# Patient Record
Sex: Female | Born: 1980 | Race: White | Hispanic: No | Marital: Single | Smoking: Current every day smoker
Health system: Southern US, Community
[De-identification: ages and names within clinical notes are randomized; demographics above are authoritative.]

## PROBLEM LIST (undated history)

## (undated) DIAGNOSIS — F329 Major depressive disorder, single episode, unspecified: Secondary | ICD-10-CM

## (undated) DIAGNOSIS — F32A Depression, unspecified: Secondary | ICD-10-CM

## (undated) DIAGNOSIS — F419 Anxiety disorder, unspecified: Secondary | ICD-10-CM

## (undated) HISTORY — PX: HERNIA REPAIR: SHX51

## (undated) HISTORY — DX: Anxiety disorder, unspecified: F41.9

## (undated) HISTORY — PX: DILATION AND CURETTAGE OF UTERUS: SHX78

---
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
## 2002-02-12 ENCOUNTER — Other Ambulatory Visit: Admission: RE | Admit: 2002-02-12 | Discharge: 2002-02-12 | Payer: Self-pay | Admitting: Obstetrics and Gynecology

## 2002-02-13 ENCOUNTER — Ambulatory Visit (HOSPITAL_COMMUNITY): Admission: RE | Admit: 2002-02-13 | Discharge: 2002-02-13 | Payer: Self-pay | Admitting: Obstetrics and Gynecology

## 2002-12-30 ENCOUNTER — Other Ambulatory Visit: Admission: RE | Admit: 2002-12-30 | Discharge: 2002-12-30 | Payer: Self-pay | Admitting: Family Medicine

## 2003-03-15 ENCOUNTER — Encounter: Payer: Self-pay | Admitting: Family Medicine

## 2003-03-15 ENCOUNTER — Ambulatory Visit (HOSPITAL_COMMUNITY): Admission: RE | Admit: 2003-03-15 | Discharge: 2003-03-15 | Payer: Self-pay | Admitting: Family Medicine

## 2003-03-30 ENCOUNTER — Other Ambulatory Visit: Admission: RE | Admit: 2003-03-30 | Discharge: 2003-03-30 | Payer: Self-pay | Admitting: Family Medicine

## 2005-05-03 ENCOUNTER — Other Ambulatory Visit: Admission: RE | Admit: 2005-05-03 | Discharge: 2005-05-03 | Payer: Self-pay | Admitting: Family Medicine

## 2006-02-15 ENCOUNTER — Ambulatory Visit: Payer: Self-pay | Admitting: Family Medicine

## 2006-02-25 ENCOUNTER — Ambulatory Visit: Payer: Self-pay | Admitting: Family Medicine

## 2006-03-04 ENCOUNTER — Ambulatory Visit: Payer: Self-pay | Admitting: Family Medicine

## 2006-07-10 ENCOUNTER — Ambulatory Visit: Payer: Self-pay | Admitting: Family Medicine

## 2006-08-08 ENCOUNTER — Ambulatory Visit: Payer: Self-pay | Admitting: Family Medicine

## 2006-10-14 ENCOUNTER — Ambulatory Visit: Payer: Self-pay | Admitting: Physician Assistant

## 2013-04-09 ENCOUNTER — Encounter (INDEPENDENT_AMBULATORY_CARE_PROVIDER_SITE_OTHER): Payer: Self-pay

## 2013-04-30 ENCOUNTER — Encounter (INDEPENDENT_AMBULATORY_CARE_PROVIDER_SITE_OTHER): Payer: Self-pay | Admitting: Surgery

## 2013-05-05 ENCOUNTER — Ambulatory Visit (INDEPENDENT_AMBULATORY_CARE_PROVIDER_SITE_OTHER): Payer: BC Managed Care – PPO | Admitting: Surgery

## 2013-10-01 ENCOUNTER — Other Ambulatory Visit: Payer: Self-pay

## 2013-12-29 ENCOUNTER — Encounter (INDEPENDENT_AMBULATORY_CARE_PROVIDER_SITE_OTHER): Payer: Self-pay | Admitting: Surgery

## 2014-01-08 ENCOUNTER — Encounter (INDEPENDENT_AMBULATORY_CARE_PROVIDER_SITE_OTHER): Payer: Self-pay | Admitting: Surgery

## 2014-01-08 ENCOUNTER — Ambulatory Visit (INDEPENDENT_AMBULATORY_CARE_PROVIDER_SITE_OTHER): Payer: Medicaid Other | Admitting: Surgery

## 2014-01-08 VITALS — BP 110/72 | HR 72 | Temp 99.0°F | Resp 14 | Ht 63.0 in | Wt 116.8 lb

## 2014-01-08 DIAGNOSIS — R140 Abdominal distension (gaseous): Secondary | ICD-10-CM

## 2014-01-08 DIAGNOSIS — R143 Flatulence: Secondary | ICD-10-CM

## 2014-01-08 DIAGNOSIS — R141 Gas pain: Secondary | ICD-10-CM

## 2014-01-08 DIAGNOSIS — K429 Umbilical hernia without obstruction or gangrene: Secondary | ICD-10-CM | POA: Insufficient documentation

## 2014-01-08 DIAGNOSIS — R112 Nausea with vomiting, unspecified: Secondary | ICD-10-CM

## 2014-01-08 DIAGNOSIS — R142 Eructation: Secondary | ICD-10-CM

## 2014-01-08 NOTE — Progress Notes (Signed)
Patient ID: Karina Cole, female   DOB: 11/09/1981, 33 y.o.   MRN: [id number]  Chief Complaint  Patient presents with  . New Evaluation    2nd opinion on UMB hernia    HPI Karina Cole is a 33 y.o. female.  Referred by Roma KayserKatie Skillman, PA-C. At [facility] For evaluation of umbilical hernia HPI This is a 33 year old female in good health who presents 6 years after giving birth to twins. Over the last couple of years she has noticed some swelling and protrusion above her umbilicus. Occasionally this area gets fairly large and uncomfortable and she has had to manually reduce this mass. Over the last several months, she has noted that whenever she eats she becomes very bloated and distended, associated with nausea, vomiting, and diarrhea. She denies any significant localized abdominal pain other than the generalized distention.  She was apparently evaluated by a surgeon in [place] who obtained a CT scan in 2013. This was read as unremarkable. We do not have these images available to review. The printed report does not mention her anterior abdominal wall. She presents now for further evaluation of her hernia.   Past Medical History  Diagnosis Date  . Anxiety     Past Surgical History  Procedure Laterality Date  . Hernia repair      when baby    Family History  Problem Relation Age of Onset  . Cancer Paternal Aunt     breast  . Cancer Paternal Uncle     colon  . Cancer Paternal Grandmother     breast  . Cancer Paternal Grandfather     colon    Social History History  Substance Use Topics  . Smoking status: Current Every Day Smoker  . Smokeless tobacco: Never Used  . Alcohol Use: Yes     Comment: sociall    No Known Allergies  Current Outpatient Prescriptions  Medication Sig Dispense Refill  . ALPRAZolam (XANAX) 1 MG tablet Take 1 mg by mouth at bedtime as needed for anxiety.      Marland Kitchen. FLUoxetine (PROZAC) 40 MG capsule Take 40 mg by mouth daily.        No current facility-administered medications for this visit.    Review of Systems Review of Systems  Constitutional: Negative for fever, chills and unexpected weight change.  HENT: Negative for congestion, hearing loss, sore throat, trouble swallowing and voice change.   Eyes: Negative for visual disturbance.  Respiratory: Negative for cough and wheezing.   Cardiovascular: Negative for chest pain, palpitations and leg swelling.  Gastrointestinal: Positive for nausea, vomiting, diarrhea and abdominal distention. Negative for abdominal pain, constipation, blood in stool and anal bleeding.  Genitourinary: Negative for hematuria, vaginal bleeding and difficulty urinating.  Musculoskeletal: Negative for arthralgias.  Skin: Negative for rash and wound.  Neurological: Negative for seizures, syncope and headaches.  Hematological: Negative for adenopathy. Does not bruise/bleed easily.  Psychiatric/Behavioral: Negative for confusion.    Blood pressure 110/72, pulse 72, temperature 99 F (37.2 C), temperature source Oral, resp. rate 14, height 5\' 3"  (1.6 m), weight 116 lb 12.8 oz (52.98 kg).  Physical Exam Physical Exam Thin WDWN in NAD HEENT:  EOMI, sclera anicteric Neck:  No masses, no thyromegaly Lungs:  CTA bilaterally; normal respiratory effort CV:  Regular rate and rhythm; no murmurs Abd:  +bowel sounds, mildly distended; small protruding hernia just above the umbilicus (2 cm) - reducible; no abdominal tenderness; mild epigastric rectus diastasis Ext:  Well-perfused; no edema Skin:  Warm, dry; no sign of jaundice  Data Reviewed CT Report from [facility] - 09/04/12 - unremarkable   Assessment    Supraumbilical hernia with adjacent rectus diastasis  Symptoms sound suspicious for possible gallbladder disease    Plan    Will obtain an ultrasound of the abdomen to determine whether she has gallbladder disease that may be causing her digestive symptoms.  We will reevaluate the  patient after the scan is complete.       Keasia Dubose K. 01/08/2014, 1:07 PM

## 2014-01-13 ENCOUNTER — Ambulatory Visit
Admission: RE | Admit: 2014-01-13 | Discharge: 2014-01-13 | Disposition: A | Discharge status: Home / Self Care | Payer: Medicaid Other | Source: Ambulatory Visit | Attending: Surgery | Admitting: Surgery

## 2014-01-13 DIAGNOSIS — R140 Abdominal distension (gaseous): Secondary | ICD-10-CM

## 2014-01-18 ENCOUNTER — Encounter (INDEPENDENT_AMBULATORY_CARE_PROVIDER_SITE_OTHER): Payer: Self-pay

## 2014-01-19 ENCOUNTER — Ambulatory Visit (INDEPENDENT_AMBULATORY_CARE_PROVIDER_SITE_OTHER): Payer: Medicaid Other | Admitting: Surgery

## 2014-01-19 ENCOUNTER — Encounter (INDEPENDENT_AMBULATORY_CARE_PROVIDER_SITE_OTHER): Payer: Self-pay | Admitting: Surgery

## 2014-01-19 DIAGNOSIS — K429 Umbilical hernia without obstruction or gangrene: Secondary | ICD-10-CM

## 2014-01-19 NOTE — Progress Notes (Signed)
Patient ID: Karina Cole, female   DOB: 12/16/1980, 33 y.o.   MRN: [id number] Patient ID: Karina Headerrie Marines, female DOB: 10/22/1981, 33 y.o. MRN: [id number]  Chief Complaint   Patient presents with   .  New Evaluation     2nd opinion on UMB hernia   HPI  Karina Headerrie Brethauer is a 33 y.o. female. Referred by Roma KayserKatie Skillman, PA-C. At [facility] For evaluation of umbilical hernia  HPI  This is a 33 year old female in good health who presents 6 years after giving birth to twins. Over the last couple of years she has noticed some swelling and protrusion above her umbilicus. Occasionally this area gets fairly large and uncomfortable and she has had to manually reduce this mass. Over the last several months, she has noted that whenever she eats she becomes very bloated and distended, associated with nausea, vomiting, and diarrhea. She denies any significant localized abdominal pain other than the generalized distention.  She was apparently evaluated by a surgeon in [place] who obtained a CT scan in 2013. This was read as unremarkable. We do not have these images available to review. The printed report does not mention her anterior abdominal wall. She presents now for further evaluation of her hernia.  Past Medical History   Diagnosis  Date   .  Anxiety     Past Surgical History   Procedure  Laterality  Date   .  Hernia repair       when baby    Family History   Problem  Relation  Age of Onset   .  Cancer  Paternal Aunt      breast   .  Cancer  Paternal Uncle      colon   .  Cancer  Paternal Grandmother      breast   .  Cancer  Paternal Grandfather      colon   Social History  History   Substance Use Topics   .  Smoking status:  Current Every Day Smoker   .  Smokeless tobacco:  Never Used   .  Alcohol Use:  Yes      Comment: sociall   No Known Allergies  Current Outpatient Prescriptions   Medication  Sig  Dispense  Refill   .  ALPRAZolam (XANAX) 1 MG tablet  Take 1 mg by  mouth at bedtime as needed for anxiety.     Marland Kitchen.  FLUoxetine (PROZAC) 40 MG capsule  Take 40 mg by mouth daily.      No current facility-administered medications for this visit.   Review of Systems  Review of Systems  Constitutional: Negative for fever, chills and unexpected weight change.  HENT: Negative for congestion, hearing loss, sore throat, trouble swallowing and voice change.  Eyes: Negative for visual disturbance.  Respiratory: Negative for cough and wheezing.  Cardiovascular: Negative for chest pain, palpitations and leg swelling.  Gastrointestinal: Positive for nausea, vomiting, diarrhea and abdominal distention. Negative for abdominal pain, constipation, blood in stool and anal bleeding.  Genitourinary: Negative for hematuria, vaginal bleeding and difficulty urinating.  Musculoskeletal: Negative for arthralgias.  Skin: Negative for rash and wound.  Neurological: Negative for seizures, syncope and headaches.  Hematological: Negative for adenopathy. Does not bruise/bleed easily.  Psychiatric/Behavioral: Negative for confusion.  Blood pressure 110/72, pulse 72, temperature 99 F (37.2 C), temperature source Oral, resp. rate 14, height 5\' 3"  (1.6 m), weight 116 lb 12.8 oz (52.98 kg).  Physical Exam  Physical Exam  Thin  WDWN in NAD  HEENT: EOMI, sclera anicteric  Neck: No masses, no thyromegaly  Lungs: CTA bilaterally; normal respiratory effort  CV: Regular rate and rhythm; no murmurs  Abd: +bowel sounds, mildly distended; small protruding hernia just above the umbilicus (2 cm) - reducible; no abdominal tenderness; mild epigastric rectus diastasis  Ext: Well-perfused; no edema  Skin: Warm, dry; no sign of jaundice  Data Reviewed  CT Report from [facility] - 09/04/12 - unremarkable  US Abdomen Complete  01/13/2014   CLINICAL DATA:  Postprandial abdominal pain/bloating  EXAM: ULTRASOUND ABDOMEN COMPLETE  COMPARISON:  CT abdomen pelvis dated 09/04/2012.  FINDINGS: Gallbladder:  No  gallstones, gallbladder wall thickening, or pericholecystic fluid. Negative sonographic Murphy's sign.  Common bile duct:  Diameter: 3 mm.  Liver:  No focal lesion identified. Within normal limits in parenchymal echogenicity.  IVC:  No abnormality visualized.  Pancreas:  Visualized portions are unremarkable.  Spleen:  Measures 6.2 cm.  Right Kidney:  Length: 10.0 cm.  No mass or hydronephrosis.  Left Kidney:  Length: 9.9 cm.  No mass or hydronephrosis.  Abdominal aorta:  No aneurysm visualized.  Other findings:  None.  IMPRESSION: Negative abdominal ultrasound.   Electronically Signed   By: Charline Bills M.D.   On: 01/13/2014 09:15     Assessment  Supraumbilical hernia with adjacent rectus diastasis   Plan  Open repair of supraumbilical hernia with mesh.  The surgical procedure has been discussed with the patient.  Potential risks, benefits, alternative treatments, and expected outcomes have been explained.  All of the patient's questions at this time have been answered.  The likelihood of reaching the patient's treatment goal is good.  The patient understand the proposed surgical procedure and wishes to proceed.

## 2014-02-22 ENCOUNTER — Encounter (HOSPITAL_COMMUNITY): Payer: Self-pay | Admitting: Pharmacy Technician

## 2014-02-24 NOTE — Patient Instructions (Addendum)
      Your procedure is scheduled on:  03/04/14 THURS  Report [facility] at   0725    AM.  Call this number if you have problems the morning of surgery: [phone]        Do not eat food  Or drink :After Midnight. Wednesday NIGHT   Take these medicines the morning of surgery with A SIP OF WATER  May take Aprazolam if needed  .  Contacts, dentures or partial plates, or metal hairpins  can not be worn to surgery. Your family will be responsible for glasses, dentures, hearing aides while you are in surgery  Leave suitcase in the car. After surgery it may be brought to your room.  For patients admitted to the hospital, checkout time is 11:00 AM day of  discharge.                DO NOT WEAR JEWELRY, LOTIONS, POWDERS, OR PERFUMES.  WOMEN-- DO NOT SHAVE LEGS OR UNDERARMS FOR 48 HOURS BEFORE SHOWERS. MEN MAY SHAVE FACE.  Patients discharged the day of surgery will not be allowed to drive home. IF going home the day of surgery, you must have a driver and someone to stay with you for the first 24 hours  Name and phone number of your driver:    Mom  DIANE                                                                                        FAILURE TO FOLLOW THESE INSTRUCTIONS MAY RESULT IN  CANCELLATION   OF YOUR SURGERY                                                  Patient Signature _____________________________

## 2014-02-25 ENCOUNTER — Encounter (HOSPITAL_COMMUNITY): Payer: Self-pay

## 2014-02-25 ENCOUNTER — Encounter (HOSPITAL_COMMUNITY)
Admission: RE | Admit: 2014-02-25 | Discharge: 2014-02-25 | Disposition: A | Discharge status: Home / Self Care | Payer: Medicaid Other | Source: Ambulatory Visit | Attending: Surgery | Admitting: Surgery

## 2014-02-25 DIAGNOSIS — Z01812 Encounter for preprocedural laboratory examination: Secondary | ICD-10-CM | POA: Insufficient documentation

## 2014-02-25 HISTORY — DX: Major depressive disorder, single episode, unspecified: F32.9

## 2014-02-25 HISTORY — DX: Depression, unspecified: F32.A

## 2014-02-25 LAB — CBC
HEMATOCRIT: 41.2 % (ref 36.0–46.0)
Hemoglobin: 13.9 g/dL (ref 12.0–15.0)
MCH: 32.3 pg (ref 26.0–34.0)
MCHC: 33.7 g/dL (ref 30.0–36.0)
MCV: 95.8 fL (ref 78.0–100.0)
Platelets: 281 10*3/uL (ref 150–400)
RBC: 4.3 MIL/uL (ref 3.87–5.11)
RDW: 12 % (ref 11.5–15.5)
WBC: 9.2 10*3/uL (ref 4.0–10.5)

## 2014-02-25 LAB — HCG, SERUM, QUALITATIVE: Preg, Serum: NEGATIVE

## 2014-03-04 ENCOUNTER — Encounter (HOSPITAL_COMMUNITY): Payer: Self-pay | Admitting: *Deleted

## 2014-03-04 ENCOUNTER — Ambulatory Visit (HOSPITAL_COMMUNITY): Payer: Medicaid Other | Admitting: *Deleted

## 2014-03-04 ENCOUNTER — Encounter (HOSPITAL_COMMUNITY)
Admission: RE | Disposition: A | Discharge status: Home / Self Care | Payer: Self-pay | Source: Ambulatory Visit | Attending: Surgery

## 2014-03-04 ENCOUNTER — Ambulatory Visit (HOSPITAL_COMMUNITY)
Admission: RE | Admit: 2014-03-04 | Discharge: 2014-03-04 | Disposition: A | Discharge status: Home / Self Care | Payer: Medicaid Other | Source: Ambulatory Visit | Attending: Surgery | Admitting: Surgery

## 2014-03-04 ENCOUNTER — Encounter (HOSPITAL_COMMUNITY): Payer: Medicaid Other | Admitting: *Deleted

## 2014-03-04 DIAGNOSIS — K429 Umbilical hernia without obstruction or gangrene: Secondary | ICD-10-CM | POA: Insufficient documentation

## 2014-03-04 DIAGNOSIS — F17200 Nicotine dependence, unspecified, uncomplicated: Secondary | ICD-10-CM | POA: Insufficient documentation

## 2014-03-04 DIAGNOSIS — M6200 Separation of muscle (nontraumatic), unspecified site: Secondary | ICD-10-CM | POA: Insufficient documentation

## 2014-03-04 DIAGNOSIS — F329 Major depressive disorder, single episode, unspecified: Secondary | ICD-10-CM | POA: Insufficient documentation

## 2014-03-04 DIAGNOSIS — F3289 Other specified depressive episodes: Secondary | ICD-10-CM | POA: Insufficient documentation

## 2014-03-04 DIAGNOSIS — Z800 Family history of malignant neoplasm of digestive organs: Secondary | ICD-10-CM | POA: Insufficient documentation

## 2014-03-04 DIAGNOSIS — F411 Generalized anxiety disorder: Secondary | ICD-10-CM | POA: Insufficient documentation

## 2014-03-04 HISTORY — PX: INSERTION OF MESH: SHX5868

## 2014-03-04 HISTORY — PX: SUPRA-UMBILICAL HERNIA: SHX6105

## 2014-03-04 SURGERY — SUPRA-UMBILICAL HERNIA
Anesthesia: Monitor Anesthesia Care | Site: Abdomen

## 2014-03-04 MED ORDER — LACTATED RINGERS IV SOLN
INTRAVENOUS | Status: DC
  Administered 2014-03-04: 1000 mL via INTRAVENOUS
  Administered 2014-03-04: 11:00:00 via INTRAVENOUS

## 2014-03-04 MED ORDER — METOCLOPRAMIDE HCL 5 MG/ML IJ SOLN
INTRAMUSCULAR | Status: DC | PRN
  Administered 2014-03-04: 10 mg via INTRAVENOUS

## 2014-03-04 MED ORDER — FENTANYL CITRATE 0.05 MG/ML IJ SOLN
INTRAMUSCULAR | Status: DC | PRN
  Administered 2014-03-04: 25 ug via INTRAVENOUS
  Administered 2014-03-04 (×4): 50 ug via INTRAVENOUS
  Administered 2014-03-04: 25 ug via INTRAVENOUS

## 2014-03-04 MED ORDER — LACTATED RINGERS IV SOLN: INTRAVENOUS | Status: DC

## 2014-03-04 MED ORDER — MIDAZOLAM HCL 5 MG/5ML IJ SOLN
INTRAMUSCULAR | Status: DC | PRN
  Administered 2014-03-04: 2 mg via INTRAVENOUS

## 2014-03-04 MED ORDER — BUPIVACAINE-EPINEPHRINE 0.25% -1:200000 IJ SOLN
INTRAMUSCULAR | Status: DC | PRN
  Administered 2014-03-04: 10 mL

## 2014-03-04 MED ORDER — EPHEDRINE SULFATE 50 MG/ML IJ SOLN
INTRAMUSCULAR | Status: DC | PRN
  Administered 2014-03-04 (×2): 5 mg via INTRAVENOUS

## 2014-03-04 MED ORDER — FENTANYL CITRATE 0.05 MG/ML IJ SOLN
INTRAMUSCULAR | Status: AC
  Filled 2014-03-04: qty 5

## 2014-03-04 MED ORDER — HYDROMORPHONE HCL PF 1 MG/ML IJ SOLN
0.2500 mg | INTRAMUSCULAR | Status: DC | PRN
  Administered 2014-03-04: 0.25 mg via INTRAVENOUS

## 2014-03-04 MED ORDER — ONDANSETRON HCL 4 MG/2ML IJ SOLN
INTRAMUSCULAR | Status: DC | PRN
  Administered 2014-03-04: 4 mg via INTRAVENOUS

## 2014-03-04 MED ORDER — MORPHINE SULFATE 10 MG/ML IJ SOLN: 2.0000 mg | INTRAMUSCULAR | Status: DC | PRN

## 2014-03-04 MED ORDER — DEXAMETHASONE SODIUM PHOSPHATE 10 MG/ML IJ SOLN
INTRAMUSCULAR | Status: DC | PRN
  Administered 2014-03-04: 5 mg via INTRAVENOUS

## 2014-03-04 MED ORDER — LIDOCAINE HCL (CARDIAC) 20 MG/ML IV SOLN
INTRAVENOUS | Status: DC | PRN
  Administered 2014-03-04: 50 mg via INTRAVENOUS

## 2014-03-04 MED ORDER — SODIUM CHLORIDE 0.9 % IJ SOLN
INTRAMUSCULAR | Status: AC
  Filled 2014-03-04: qty 10

## 2014-03-04 MED ORDER — PROPOFOL 10 MG/ML IV BOLUS
INTRAVENOUS | Status: AC
  Filled 2014-03-04: qty 20

## 2014-03-04 MED ORDER — ONDANSETRON HCL 4 MG/2ML IJ SOLN: 4.0000 mg | INTRAMUSCULAR | Status: DC | PRN

## 2014-03-04 MED ORDER — BUPIVACAINE LIPOSOME 1.3 % IJ SUSP
20.0000 mL | Freq: Once | INTRAMUSCULAR | Status: DC
  Filled 2014-03-04: qty 20

## 2014-03-04 MED ORDER — CEFAZOLIN SODIUM-DEXTROSE 2-3 GM-% IV SOLR
2.0000 g | INTRAVENOUS | Status: AC
  Administered 2014-03-04: 2 g via INTRAVENOUS

## 2014-03-04 MED ORDER — 0.9 % SODIUM CHLORIDE (POUR BTL) OPTIME
TOPICAL | Status: DC | PRN
  Administered 2014-03-04: 1000 mL

## 2014-03-04 MED ORDER — OXYCODONE-ACETAMINOPHEN 5-325 MG PO TABS: 1.0000 | ORAL_TABLET | ORAL | Status: DC | PRN

## 2014-03-04 MED ORDER — MIDAZOLAM HCL 2 MG/2ML IJ SOLN
INTRAMUSCULAR | Status: AC
  Filled 2014-03-04: qty 2

## 2014-03-04 MED ORDER — ROCURONIUM BROMIDE 100 MG/10ML IV SOLN
INTRAVENOUS | Status: AC
  Filled 2014-03-04: qty 1

## 2014-03-04 MED ORDER — LIDOCAINE HCL (CARDIAC) 20 MG/ML IV SOLN
INTRAVENOUS | Status: AC
  Filled 2014-03-04: qty 5

## 2014-03-04 MED ORDER — EPHEDRINE SULFATE 50 MG/ML IJ SOLN
INTRAMUSCULAR | Status: AC
  Filled 2014-03-04: qty 1

## 2014-03-04 MED ORDER — BUPIVACAINE-EPINEPHRINE PF 0.25-1:200000 % IJ SOLN
INTRAMUSCULAR | Status: AC
  Filled 2014-03-04: qty 30

## 2014-03-04 MED ORDER — HYDROMORPHONE HCL PF 1 MG/ML IJ SOLN
INTRAMUSCULAR | Status: AC
  Filled 2014-03-04: qty 1

## 2014-03-04 MED ORDER — PROPOFOL 10 MG/ML IV BOLUS
INTRAVENOUS | Status: DC | PRN
  Administered 2014-03-04: 190 mg via INTRAVENOUS

## 2014-03-04 MED ORDER — CEFAZOLIN SODIUM-DEXTROSE 2-3 GM-% IV SOLR
INTRAVENOUS | Status: AC
  Filled 2014-03-04: qty 50

## 2014-03-04 MED ORDER — PROMETHAZINE HCL 25 MG/ML IJ SOLN: 6.2500 mg | INTRAMUSCULAR | Status: DC | PRN

## 2014-03-04 MED ORDER — ONDANSETRON HCL 4 MG/2ML IJ SOLN
INTRAMUSCULAR | Status: AC
  Filled 2014-03-04: qty 2

## 2014-03-04 SURGICAL SUPPLY — 38 items
BENZOIN TINCTURE PRP APPL 2/3 (GAUZE/BANDAGES/DRESSINGS) ×4 IMPLANT
BLADE HEX COATED 2.75 (ELECTRODE) ×4 IMPLANT
BLADE SURG 15 STRL LF DISP TIS (BLADE) ×2 IMPLANT
BLADE SURG 15 STRL SS (BLADE) ×2
CLOSURE WOUND 1/2 X4 (GAUZE/BANDAGES/DRESSINGS) ×1
DECANTER SPIKE VIAL GLASS SM (MISCELLANEOUS) ×4 IMPLANT
DRAPE LAPAROTOMY T 102X78X121 (DRAPES) ×4 IMPLANT
DRAPE UTILITY XL STRL (DRAPES) ×4 IMPLANT
DRSG TEGADERM 4X4.75 (GAUZE/BANDAGES/DRESSINGS) ×4 IMPLANT
ELECT REM PT RETURN 9FT ADLT (ELECTROSURGICAL) ×4
ELECTRODE REM PT RTRN 9FT ADLT (ELECTROSURGICAL) ×2 IMPLANT
GLOVE BIO SURGEON STRL SZ7 (GLOVE) ×4 IMPLANT
GLOVE BIOGEL PI IND STRL 7.0 (GLOVE) IMPLANT
GLOVE BIOGEL PI IND STRL 7.5 (GLOVE) ×2 IMPLANT
GLOVE BIOGEL PI INDICATOR 7.0 (GLOVE)
GLOVE BIOGEL PI INDICATOR 7.5 (GLOVE) ×2
GOWN STRL REUS W/TWL LRG LVL3 (GOWN DISPOSABLE) ×8 IMPLANT
GOWN STRL REUS W/TWL XL LVL3 (GOWN DISPOSABLE) ×4 IMPLANT
KIT BASIN OR (CUSTOM PROCEDURE TRAY) ×4 IMPLANT
MESH VENTRALEX ST 1-7/10 CRC S (Mesh General) ×4 IMPLANT
NEEDLE HYPO 22GX1.5 SAFETY (NEEDLE) ×4 IMPLANT
NEEDLE HYPO 25X1 1.5 SAFETY (NEEDLE) IMPLANT
NS IRRIG 1000ML POUR BTL (IV SOLUTION) ×4 IMPLANT
PACK BASIC VI WITH GOWN DISP (CUSTOM PROCEDURE TRAY) ×4 IMPLANT
PENCIL BUTTON HOLSTER BLD 10FT (ELECTRODE) ×4 IMPLANT
SPONGE GAUZE 4X4 12PLY (GAUZE/BANDAGES/DRESSINGS) ×4 IMPLANT
SPONGE LAP 4X18 X RAY DECT (DISPOSABLE) ×8 IMPLANT
STRIP CLOSURE SKIN 1/2X4 (GAUZE/BANDAGES/DRESSINGS) ×3 IMPLANT
SUT MNCRL AB 4-0 PS2 18 (SUTURE) ×4 IMPLANT
SUT NOVA NAB DX-16 0-1 5-0 T12 (SUTURE) IMPLANT
SUT NOVA NAB GS-21 0 18 T12 DT (SUTURE) ×8 IMPLANT
SUT PROLENE 0 CT 1 CR/8 (SUTURE) IMPLANT
SUT PROLENE 0 CT 2 (SUTURE) IMPLANT
SUT VIC AB 3-0 SH 27 (SUTURE)
SUT VIC AB 3-0 SH 27X BRD (SUTURE) IMPLANT
SUT VIC AB 3-0 SH 27XBRD (SUTURE) IMPLANT
SYR CONTROL 10ML LL (SYRINGE) ×4 IMPLANT
TOWEL OR 17X26 10 PK STRL BLUE (TOWEL DISPOSABLE) ×4 IMPLANT

## 2014-03-04 NOTE — Anesthesia Postprocedure Evaluation (Signed)
Anesthesia Post Note  Patient: Karina Cole  Procedure(s) Performed: Procedure(s) (LRB): SUPRA-UMBILICAL HERNIA (N/A) INSERTION OF MESH (N/A)  Anesthesia type: General  Patient location: PACU  Post pain: Pain level controlled  Post assessment: Post-op Vital signs reviewed  Last Vitals:  Filed Vitals:   03/04/14 1036  BP: 122/68  Pulse: 77  Temp: 36.8 C  Resp: 12    Post vital signs: Reviewed  Level of consciousness: sedated  Complications: No apparent anesthesia complications

## 2014-03-04 NOTE — Discharge Instructions (Signed)
[facility] ° °UMBILICAL HERNIA REPAIR: POST OP INSTRUCTIONS ° °Always review your discharge instruction sheet given to you by the facility where your surgery was performed. °IF YOU HAVE DISABILITY OR FAMILY LEAVE FORMS, YOU MUST BRING THEM TO THE OFFICE FOR PROCESSING.   °DO NOT GIVE THEM TO YOUR DOCTOR. ° °1. A  prescription for pain medication may be given to you upon discharge.  Take your pain medication as prescribed, if needed.  If narcotic pain medicine is not needed, then you may take acetaminophen (Tylenol) or ibuprofen (Advil) as needed. °2. Take your usually prescribed medications unless otherwise directed. °3. If you need a refill on your pain medication, please contact your pharmacy.  They will contact our office to request authorization. Prescriptions will not be filled after 5 pm or on week-ends. °4. You should follow a light diet the first 24 hours after arrival home, such as soup and crackers, etc.  Be sure to include lots of fluids daily.  Resume your normal diet the day after surgery. °5. Most patients will experience some swelling and bruising around the umbilicus or in the groin and scrotum.  Ice packs and reclining will help.  Swelling and bruising can take several days to resolve.  °6. It is common to experience some constipation if taking pain medication after surgery.  Increasing fluid intake and taking a stool softener (such as Colace) will usually help or prevent this problem from occurring.  A mild laxative (Milk of Magnesia or Miralax) should be taken according to package directions if there are no bowel movements after 48 hours. °7. Unless discharge instructions indicate otherwise, you may remove your bandages 24-48 hours after surgery, and you may shower at that time.  You will have steri-strips (small skin tapes) in place directly over the incision.  These strips should be left on the skin for 7-10 days. °8. ACTIVITIES:  You may resume regular (light) daily activities  beginning the next day--such as daily self-care, walking, climbing stairs--gradually increasing activities as tolerated.  You may have sexual intercourse when it is comfortable.  Refrain from any heavy lifting or straining until approved by your doctor. °a. You may drive when you are no longer taking prescription pain medication, you can comfortably wear a seatbelt, and you can safely maneuver your car and apply brakes. °b. RETURN TO WORK:  2-3 weeks with light duty - no lifting over 15 lbs. °9. You should see your doctor in the office for a follow-up appointment approximately 2-3 weeks after your surgery.  Make sure that you call for this appointment within a day or two after you arrive home to insure a convenient appointment time. °10. OTHER INSTRUCTIONS:  __________________________________________________________________________________________________________________________________________________________________________________________  °WHEN TO CALL YOUR DOCTOR: °1. Fever over 101.0 °2. Inability to urinate °3. Nausea and/or vomiting °4. Extreme swelling or bruising °5. Continued bleeding from incision. °6. Increased pain, redness, or drainage from the incision ° °The clinic staff is available to answer your questions during regular business hours.  Please don’t hesitate to call and ask to speak to one of the nurses for clinical concerns.  If you have a medical emergency, go to the nearest emergency room or call 911.  A surgeon from [facility] is always on call at the hospital ° ° °[address], [address], [place], [place]  [address] ? ° [address] [address], [place], [place]   [address] °[phone]    [phone]    FAX [phone] °Web site: [email or web address] ° ° °

## 2014-03-04 NOTE — H&P (Signed)
Progress Notes    Patient ID: Karina Cole, female   DOB: Oct 25, 1981, 33 y.o.   MRN: [id number] Patient ID: Karina Cole, female DOB: 01-23-1981, 33 y.o. MRN: [id number]   Chief Complaint    Patient presents with    .   New Evaluation        2nd opinion on UMB hernia     HPI   Karina Cole is a 33 y.o. female. Referred by Roma Kayser, PA-C. At [facility] For evaluation of umbilical hernia   HPI   This is a 33 year old female in good health who presents 6 years after giving birth to twins. Over the last couple of years she has noticed some swelling and protrusion above her umbilicus. Occasionally this area gets fairly large and uncomfortable and she has had to manually reduce this mass. Over the last several months, she has noted that whenever she eats she becomes very bloated and distended, associated with nausea, vomiting, and diarrhea. She denies any significant localized abdominal pain other than the generalized distention.   She was apparently evaluated by a surgeon in [place] who obtained a CT scan in 2013. This was read as unremarkable. We do not have these images available to review. The printed report does not mention her anterior abdominal wall. She presents now for further evaluation of her hernia.   Past Medical History    Diagnosis   Date    .   Anxiety          Past Surgical History    Procedure   Laterality   Date    .   Hernia repair            when baby        Family History    Problem   Relation   Age of Onset    .   Cancer   Paternal Aunt          breast    .   Cancer   Paternal Uncle          colon    .   Cancer   Paternal Grandmother          breast    .   Cancer   Paternal Grandfather          colon     Social History   History    Substance Use Topics    .   Smoking status:   Current Every Day Smoker    .   Smokeless tobacco:   Never Used    .   Alcohol Use:   Yes          Comment: sociall     No Known Allergies    Current Outpatient Prescriptions    Medication   Sig   Dispense   Refill    .   ALPRAZolam (XANAX) 1 MG tablet   Take 1 mg by mouth at bedtime as needed for anxiety.        Marland Kitchen   FLUoxetine (PROZAC) 40 MG capsule   Take 40 mg by mouth daily.            No current facility-administered medications for this visit.     Review of Systems   Review of Systems   Constitutional: Negative for fever, chills and unexpected weight change.   HENT: Negative for congestion, hearing loss, sore throat, trouble swallowing and voice change.   Eyes: Negative for visual disturbance.  Respiratory: Negative for cough and wheezing.   Cardiovascular: Negative for chest pain, palpitations and leg swelling.   Gastrointestinal: Positive for nausea, vomiting, diarrhea and abdominal distention. Negative for abdominal pain, constipation, blood in stool and anal bleeding.   Genitourinary: Negative for hematuria, vaginal bleeding and difficulty urinating.   Musculoskeletal: Negative for arthralgias.   Skin: Negative for rash and wound.   Neurological: Negative for seizures, syncope and headaches.   Hematological: Negative for adenopathy. Does not bruise/bleed easily.   Psychiatric/Behavioral: Negative for confusion.   Blood pressure 110/72, pulse 72, temperature 99 F (37.2 C), temperature source Oral, resp. rate 14, height 5\' 3"  (1.6 m), weight 116 lb 12.8 oz (52.98 kg).   Physical Exam   Physical Exam   Thin WDWN in NAD   HEENT: EOMI, sclera anicteric   Neck: No masses, no thyromegaly   Lungs: CTA bilaterally; normal respiratory effort   CV: Regular rate and rhythm; no murmurs   Abd: +bowel sounds, mildly distended; small protruding hernia just above the umbilicus (2 cm) - reducible; no abdominal tenderness; mild epigastric rectus diastasis   Ext: Well-perfused; no edema   Skin: Warm, dry; no sign of jaundice   Data Reviewed   [facility]12 - unremarkable  [place] Abdomen Complete   01/13/2014    CLINICAL DATA:  Postprandial abdominal pain/bloating  EXAM: ULTRASOUND ABDOMEN COMPLETE  COMPARISON:  CT abdomen pelvis dated 09/04/2012.  FINDINGS: Gallbladder:  No gallstones, gallbladder wall thickening, or pericholecystic fluid. Negative sonographic Murphy's sign.  Common bile duct:  Diameter: 3 mm.  Liver:  No focal lesion identified. Within normal limits in parenchymal echogenicity.  IVC:  No abnormality visualized.  Pancreas:  Visualized portions are unremarkable.  Spleen:  Measures 6.2 cm.  Right Kidney:  Length: 10.0 cm.  No mass or hydronephrosis.  Left Kidney:  Length: 9.9 cm.  No mass or hydronephrosis.  Abdominal aorta:  No aneurysm visualized.  Other findings:  None.  IMPRESSION: Negative abdominal ultrasound.   Electronically Signed   By: Charline BillsSriyesh  Krishnan M.D.   On: 01/13/2014 09:15         Assessment   Supraumbilical hernia with adjacent rectus diastasis    Plan   Open repair of supraumbilical hernia with mesh.  The surgical procedure has been discussed with the patient.  Potential risks, benefits, alternative treatments, and expected outcomes have been explained.  All of the patient's questions at this time have been answered.  The likelihood of reaching the patient's treatment goal is good.  The patient understand the proposed surgical procedure and wishes to proceed.    Wilmon ArmsMatthew K. Corliss Skainssuei, MD, [facility]  General/ Trauma Surgery  03/04/2014 7:30 AM

## 2014-03-04 NOTE — Op Note (Signed)
Indications:  The patient presented with a history of tender enlarging supraumbilical hernia.  The patient was examined and we recommended umbilical hernia repair with mesh.  Pre-operative diagnosis:  Umbilical hernia  Post-operative diagnosis:  Same  Surgeon: Leialoha Hanna K.   Assistants: none  Anesthesia: General LMA anesthesia  ASA Class: 1   Procedure Details  The patient was seen again in the Holding Room. The risks, benefits, complications, treatment options, and expected outcomes were discussed with the patient. The possibilities of reaction to medication, pulmonary aspiration, perforation of viscus, bleeding, recurrent infection, the need for additional procedures, and development of a complication requiring transfusion or further operation were discussed with the patient and/or family. There was concurrence with the proposed plan, and informed consent was obtained. The site of surgery was properly noted/marked. The patient was taken to the Operating Room, identified as Karina Cole, and the procedure verified as umbilical hernia repair. A Time Out was held and the above information confirmed.  After an adequate level of general anesthesia was obtained, the patient's abdomen was prepped with Chloraprep and draped in sterile fashion.  We made a vertical incision above the umbilicus. We excised the skin tunnel from her old piercing.   Dissection was carried down to the hernia sac with cautery.  We dissected bluntly around the hernia sac down to the edge of the fascial defect.  We reduced the hernia sac back into the pre-peritoneal space.  The fascial defect measured 1 cm.  We cleared the fascia in all directions.  A small Ventralex mesh was inserted into the pre-peritoneal space and was deployed.  The mesh was secured with four trans-fascial sutures of 0 Novofil.  The fascial defect was closed with multiple interrupted figure-of-eight 0 Novofil sutures.  The base of the umbilicus was  tacked down with 3-0 Vicryl.  3-0 Vicryl was used to close the subcutaneous tissues and 4-0 Monocryl was used to close the skin.  Steri-strips and clean dressing were applied.  The patient was extubated and brought to the recovery room in stable condition.  All sponge, instrument, and needle counts were correct prior to closure and at the conclusion of the case.   Estimated Blood Loss: Minimal          Complications: None; patient tolerated the procedure well.         Disposition: PACU - hemodynamically stable.         Condition: stable  Wilmon ArmsMatthew K. Corliss Skainssuei, MD, [facility]  General/ Trauma Surgery  03/04/2014 10:33 AM

## 2014-03-04 NOTE — Transfer of Care (Signed)
Immediate Anesthesia Transfer of Care Note  Patient: Karina Cole  Procedure(s) Performed: Procedure(s): SUPRA-UMBILICAL HERNIA (N/A) INSERTION OF MESH (N/A)  Patient Location: PACU  Anesthesia Type:General  Level of Consciousness: awake, alert , oriented and patient cooperative  Airway & Oxygen Therapy: Patient Spontanous Breathing and Patient connected to face mask oxygen  Post-op Assessment: Report given to PACU RN, Post -op Vital signs reviewed and stable and Patient moving all extremities  Post vital signs: Reviewed and stable  Complications: No apparent anesthesia complications

## 2014-03-04 NOTE — Anesthesia Preprocedure Evaluation (Addendum)
Anesthesia Evaluation  Patient identified by MRN, date of birth, ID band Patient awake    Reviewed: Allergy & Precautions, H&P , NPO status , Patient's Chart, lab work & pertinent test results  Airway Mallampati: II TM Distance: >3 FB Neck ROM: Full    Dental  (+) Teeth Intact, Dental Advisory Given   Pulmonary Current Smoker,  breath sounds clear to auscultation  Pulmonary exam normal       Cardiovascular negative cardio ROS  Rhythm:Regular Rate:Normal     Neuro/Psych Anxiety Depression negative neurological ROS     GI/Hepatic negative GI ROS, Neg liver ROS,   Endo/Other  negative endocrine ROS  Renal/GU negative Renal ROS  negative genitourinary   Musculoskeletal negative musculoskeletal ROS (+)   Abdominal   Peds  Hematology negative hematology ROS (+)   Anesthesia Other Findings   Reproductive/Obstetrics negative OB ROS                          Anesthesia Physical Anesthesia Plan  ASA: II  Anesthesia Plan: MAC and General   Post-op Pain Management:    Induction: Intravenous  Airway Management Planned: Oral ETT  Additional Equipment:   Intra-op Plan:   Post-operative Plan: Extubation in OR  Informed Consent: I have reviewed the patients History and Physical, chart, labs and discussed the procedure including the risks, benefits and alternatives for the proposed anesthesia with the patient or authorized representative who has indicated his/her understanding and acceptance.   Dental advisory given  Plan Discussed with: CRNA  Anesthesia Plan Comments:        Anesthesia Quick Evaluation

## 2014-03-05 ENCOUNTER — Telehealth (INDEPENDENT_AMBULATORY_CARE_PROVIDER_SITE_OTHER): Payer: Self-pay | Admitting: General Surgery

## 2014-03-05 ENCOUNTER — Encounter (HOSPITAL_COMMUNITY): Payer: Self-pay | Admitting: Surgery

## 2014-03-05 NOTE — Telephone Encounter (Signed)
Patient called in explaining that she is having severe pain s/p supra-umbilical hernia on 03/04/14.  She explained that she has a difficult time moving from a laying to standing position.  She feels as though she has to hold her stomach to give support.  I informed her this is normal 1 day post op.  Encouraged to keep taking her medication as directed from Dr. Corliss Skainssuei.  Informed her that if this continues to be a problem, they do sell abdominal binders at the medical supply stores and that could help with giving her abdomen extra support.

## 2014-03-11 ENCOUNTER — Encounter (INDEPENDENT_AMBULATORY_CARE_PROVIDER_SITE_OTHER): Payer: Self-pay | Admitting: General Surgery

## 2014-03-15 ENCOUNTER — Telehealth (INDEPENDENT_AMBULATORY_CARE_PROVIDER_SITE_OTHER): Payer: Self-pay

## 2014-03-15 NOTE — Telephone Encounter (Signed)
Patient states when she eats or drinks it feels as if the food or drink is getting stuck in the chest cavity . Denies having constipation or diarrhea . Please advise

## 2014-03-15 NOTE — Telephone Encounter (Signed)
Called patient and told her that she will need to see her PCP doctor. The feeling of food getting stuck in the her chest cavity is not coming from her hernia surgery

## 2014-04-02 ENCOUNTER — Encounter (INDEPENDENT_AMBULATORY_CARE_PROVIDER_SITE_OTHER): Payer: Self-pay | Admitting: Surgery

## 2014-04-02 ENCOUNTER — Ambulatory Visit (INDEPENDENT_AMBULATORY_CARE_PROVIDER_SITE_OTHER): Payer: Medicaid Other | Admitting: Surgery

## 2014-04-02 VITALS — BP 110/71 | HR 86 | Temp 98.6°F | Resp 16 | Ht 63.0 in | Wt 115.6 lb

## 2014-04-02 DIAGNOSIS — K429 Umbilical hernia without obstruction or gangrene: Secondary | ICD-10-CM

## 2014-04-02 NOTE — Progress Notes (Signed)
S/p open repair of supraumbilical hernia with mesh on 03/04/14.  The patient is doing quite well and the incision is healing with no sign of infection.  No sign of recurrent hernia.  She is disappointed that I had to excise the area of her previous skin piercing, but I explained that after I had dissected around the hernia sac, there was a hole in the skin overlying the hernia repair.  I could not leave an opening in the skin because the risk of infection to the repair and to the mesh.  As the scar heals and finishes remodeling, she may choose to have this area pierced again, but I asked to wait at least a year.  She may resume full activity.  Follow-up PRN.  Wilmon ArmsMatthew K. Corliss Skainssuei, MD, [facility] Center[facility]  General/ Trauma Surgery  04/02/2014 4:07 PM

## 2015-09-24 IMAGING — US US ABDOMEN COMPLETE
1 series · 14 of 25 positions shown · non-contrast
Comparison: CT abdomen pelvis dated 09/04/2012.

CLINICAL DATA: Postprandial abdominal pain/bloating

EXAM:
ULTRASOUND ABDOMEN COMPLETE

[Series 1: us abdomen complete · 0.23mm/px · 14 of 68 slices shown]
[im 1/68]
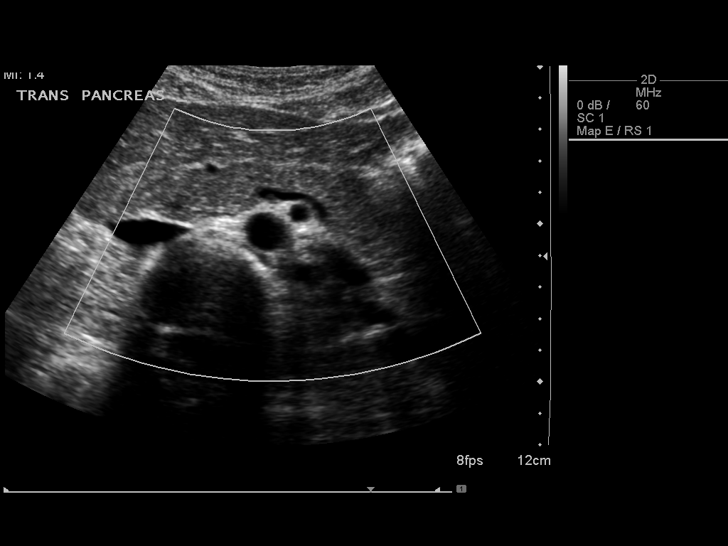
[im 6/68]
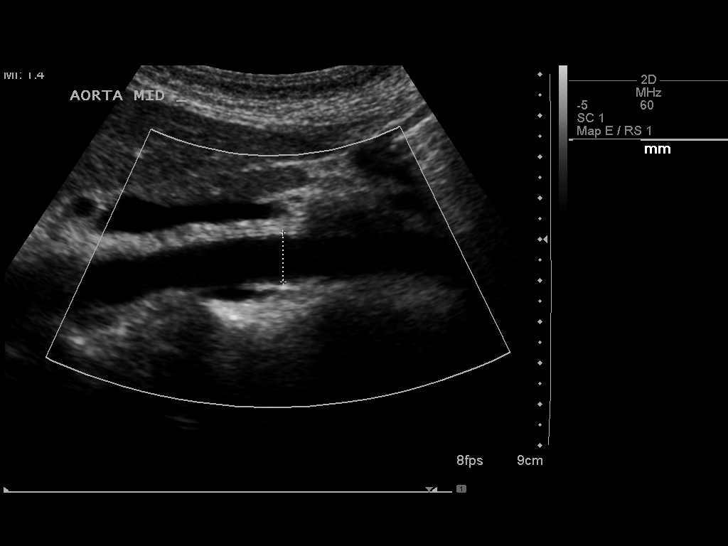
[im 12/68]
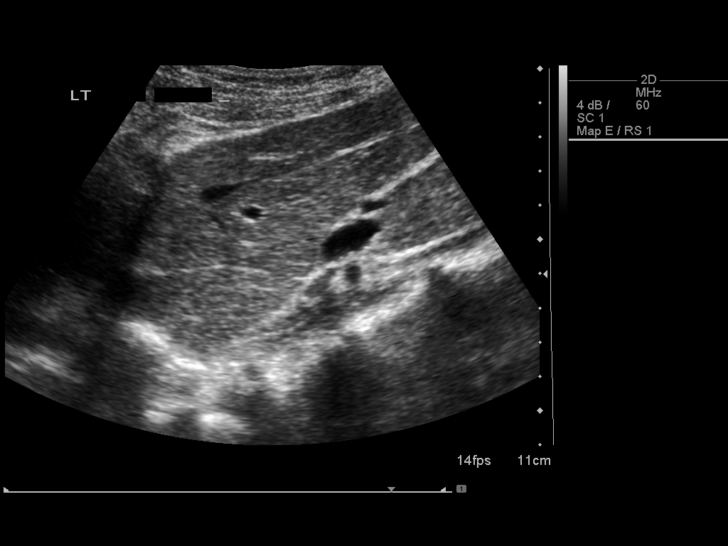
[im 17/68]
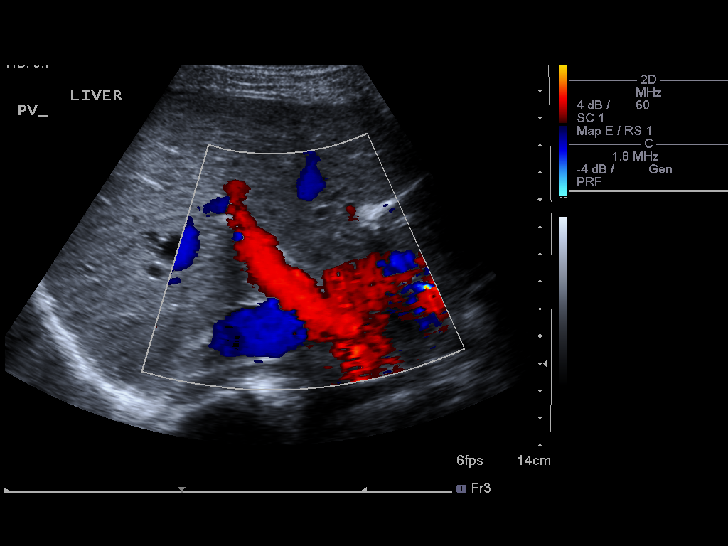
[im 23/68]
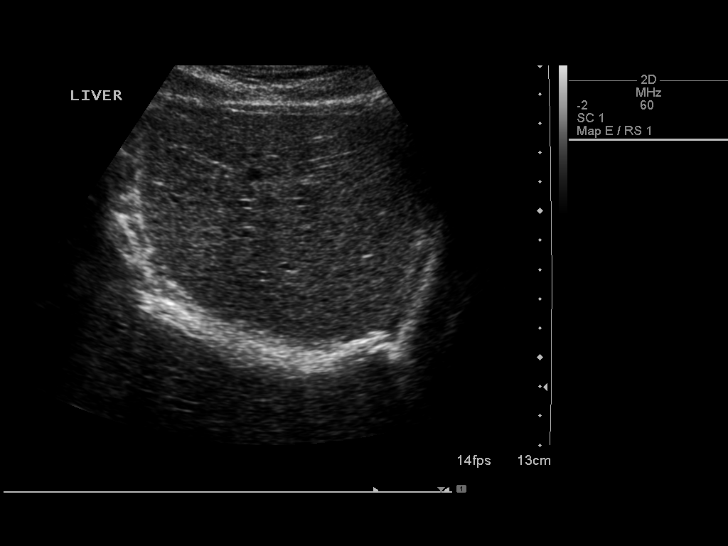
[im 26/68]
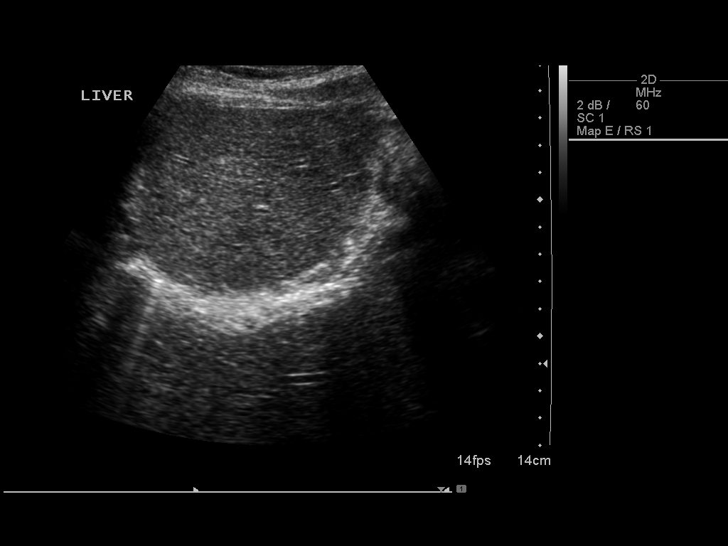
[im 31/68]
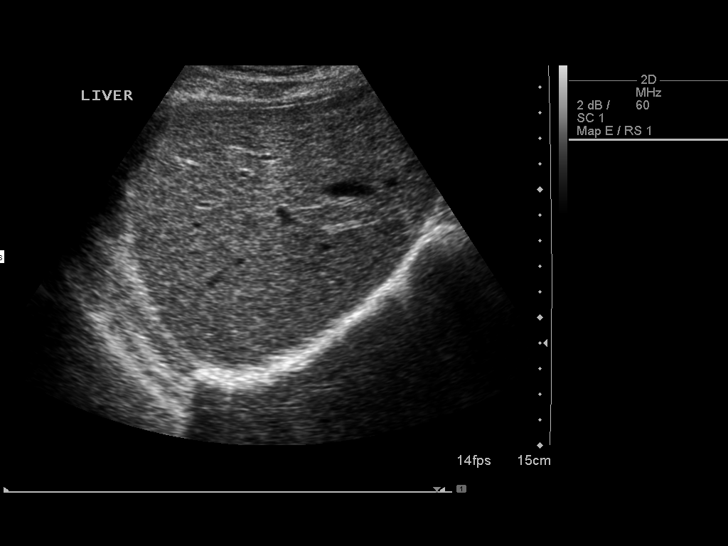
[im 37/68]
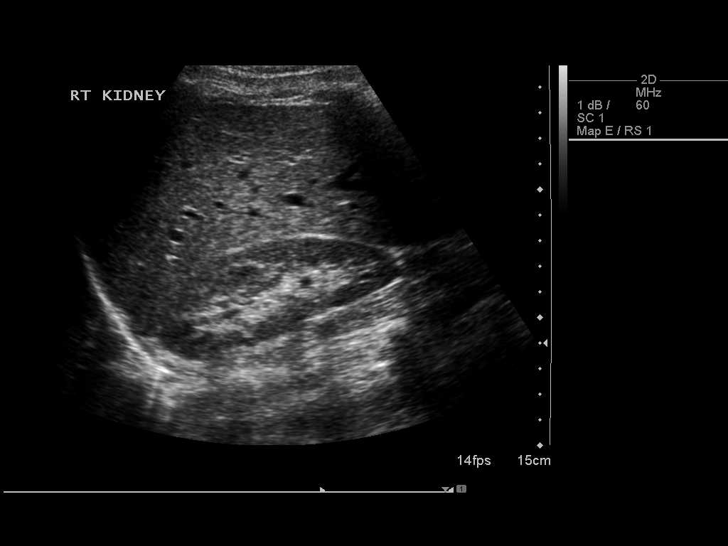
[im 42/68]
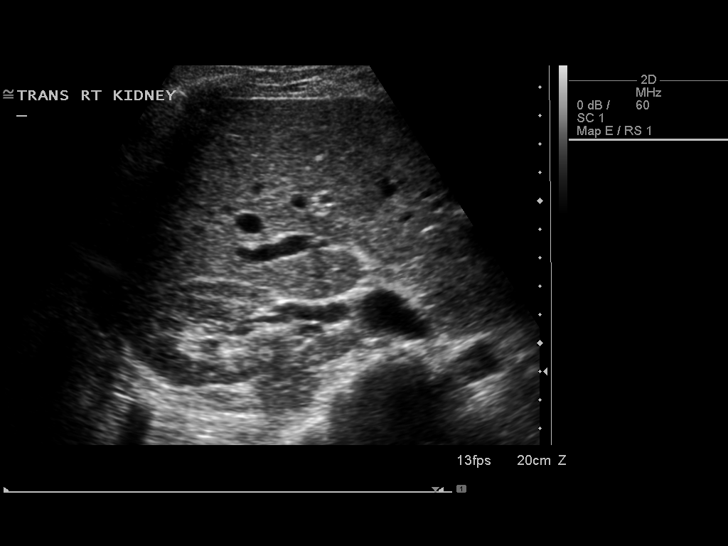
[im 45/68]
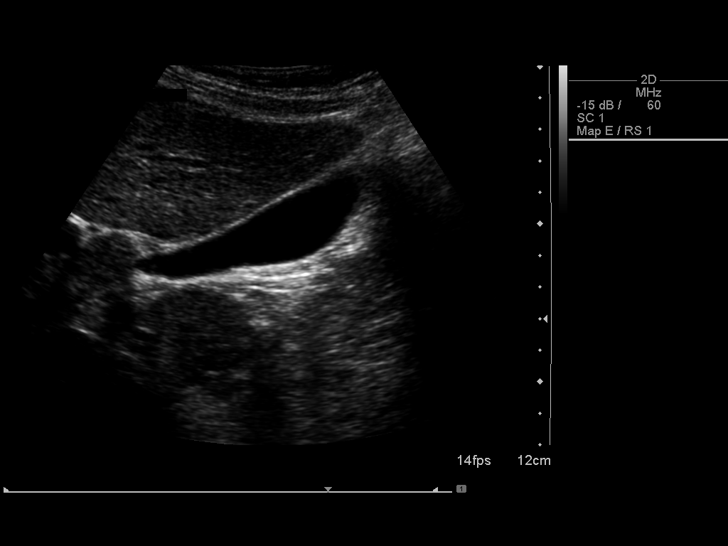
[im 51/68]
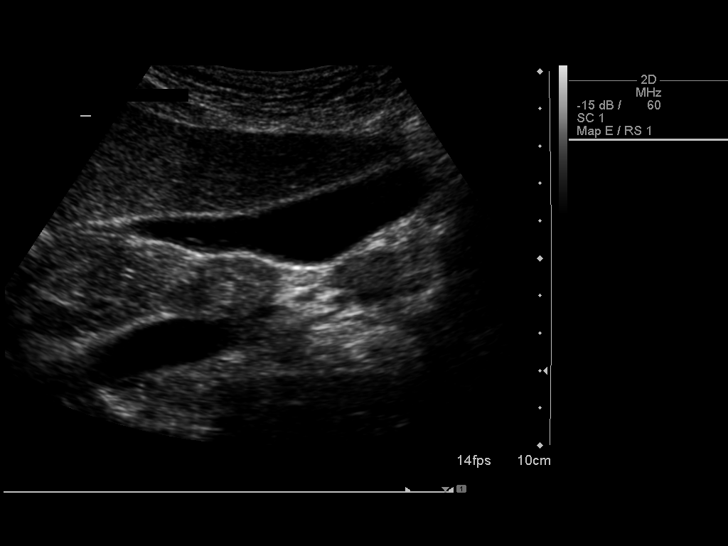
[im 56/68]
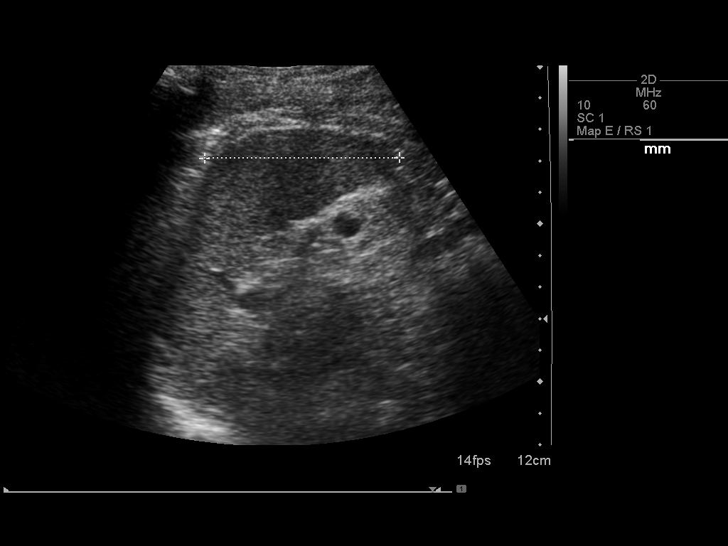
[im 62/68]
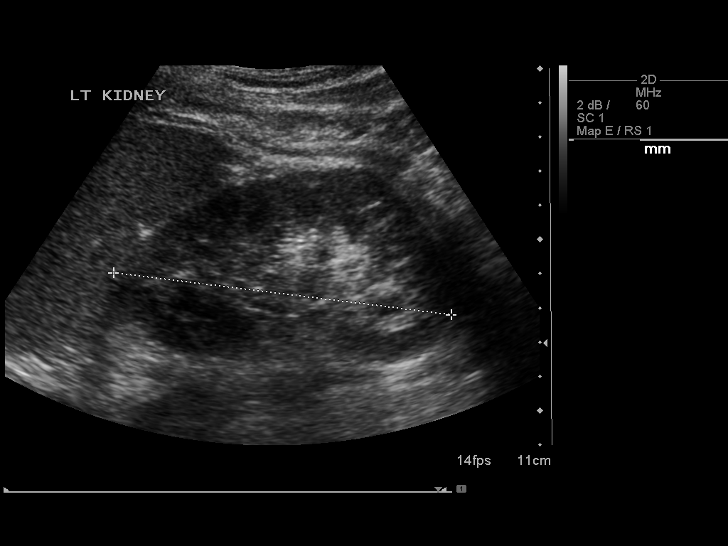
[im 68/68]
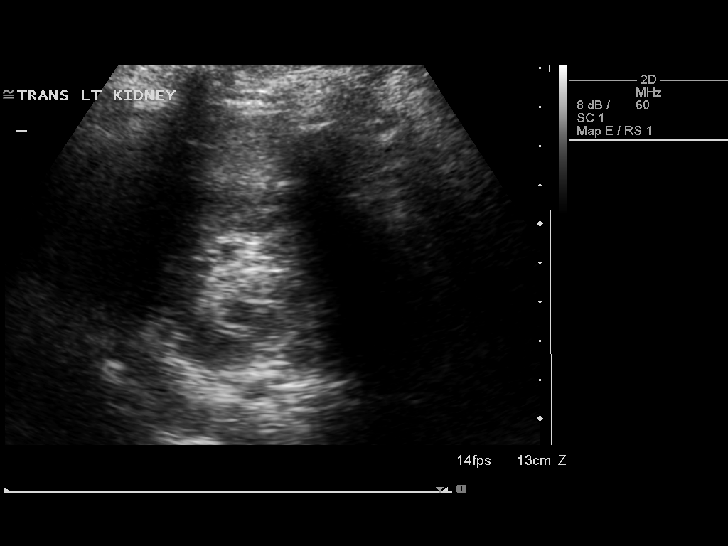

[14 of 25 positions shown; findings below may reference images not displayed]

FINDINGS: Gallbladder:

No gallstones, gallbladder wall thickening, or pericholecystic
fluid. Negative sonographic Murphy's sign.

Common bile duct:

Diameter: 3 mm.

Liver:

No focal lesion identified. Within normal limits in parenchymal
echogenicity.

IVC:

No abnormality visualized.

Pancreas:

Visualized portions are unremarkable.

Spleen:

Measures 6.2 cm.

Right Kidney:

Length: 10.0 cm.  No mass or hydronephrosis.

Left Kidney:

Length: 9.9 cm.  No mass or hydronephrosis.

Abdominal aorta:

No aneurysm visualized.

Other findings:

None.
IMPRESSION: Negative abdominal ultrasound.

## 2017-03-04 ENCOUNTER — Other Ambulatory Visit (HOSPITAL_COMMUNITY): Payer: Self-pay | Admitting: Family Medicine

## 2017-03-04 ENCOUNTER — Other Ambulatory Visit (HOSPITAL_COMMUNITY): Payer: Self-pay | Admitting: Physician Assistant

## 2017-03-04 DIAGNOSIS — N6310 Unspecified lump in the right breast, unspecified quadrant: Secondary | ICD-10-CM

## 2017-03-04 DIAGNOSIS — R52 Pain, unspecified: Secondary | ICD-10-CM

## 2017-03-26 ENCOUNTER — Encounter (HOSPITAL_COMMUNITY): Payer: Medicaid Other

## 2017-04-10 ENCOUNTER — Encounter (HOSPITAL_COMMUNITY): Payer: Self-pay

## 2017-04-10 ENCOUNTER — Ambulatory Visit (HOSPITAL_COMMUNITY)
Admission: RE | Admit: 2017-04-10 | Discharge: 2017-04-10 | Disposition: A | Discharge status: Home / Self Care | Payer: Medicaid Other | Source: Ambulatory Visit | Attending: Physician Assistant | Admitting: Physician Assistant

## 2017-04-10 DIAGNOSIS — R52 Pain, unspecified: Secondary | ICD-10-CM

## 2017-04-10 DIAGNOSIS — N6310 Unspecified lump in the right breast, unspecified quadrant: Secondary | ICD-10-CM

## 2017-04-10 DIAGNOSIS — N644 Mastodynia: Secondary | ICD-10-CM | POA: Diagnosis present

## 2017-04-16 ENCOUNTER — Encounter (HOSPITAL_COMMUNITY): Payer: Medicaid Other

## 2018-05-18 IMAGING — US ULTRASOUND RIGHT BREAST LIMITED
1 series · 5 of 5 positions shown · non-contrast
Comparison: Previous exam(s).

CLINICAL DATA: The patient recently had a palpable lump in the
right breast which has resolved.

EXAM:
DIGITAL DIAGNOSTIC BILATERAL MAMMOGRAM WITH CAD
ULTRASOUND RIGHT BREAST

[Series 1: ultrasound right breast limited · 0.07mm/px · 5 of 5 slices shown]
[im 1/5]
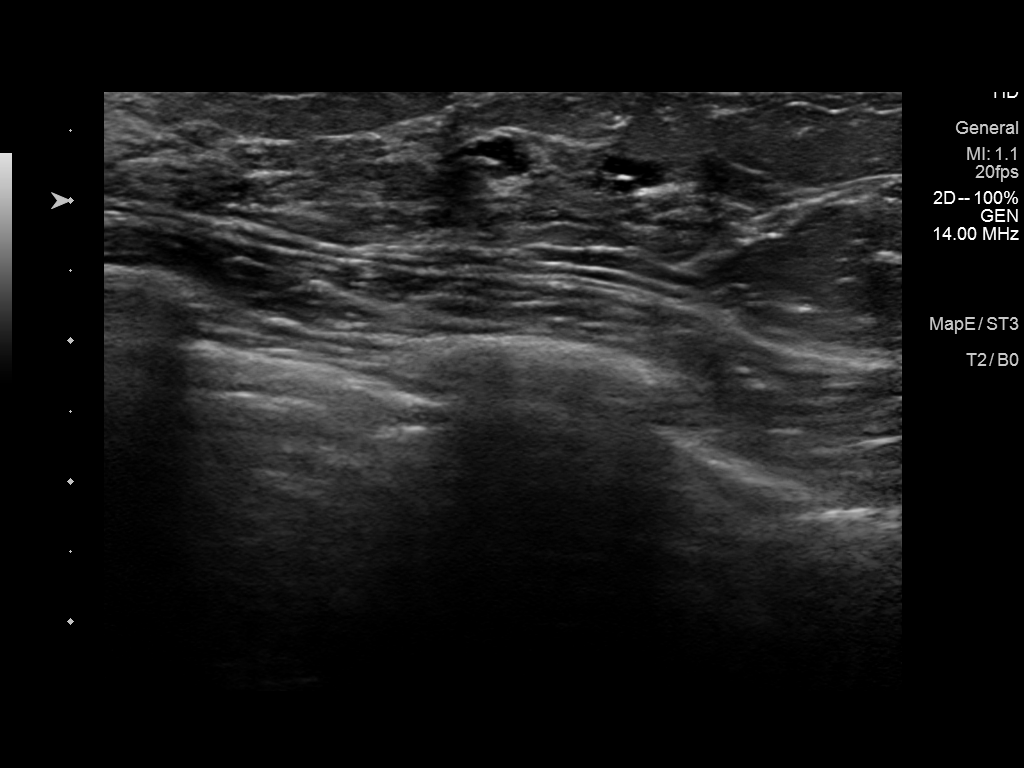
[im 2/5]
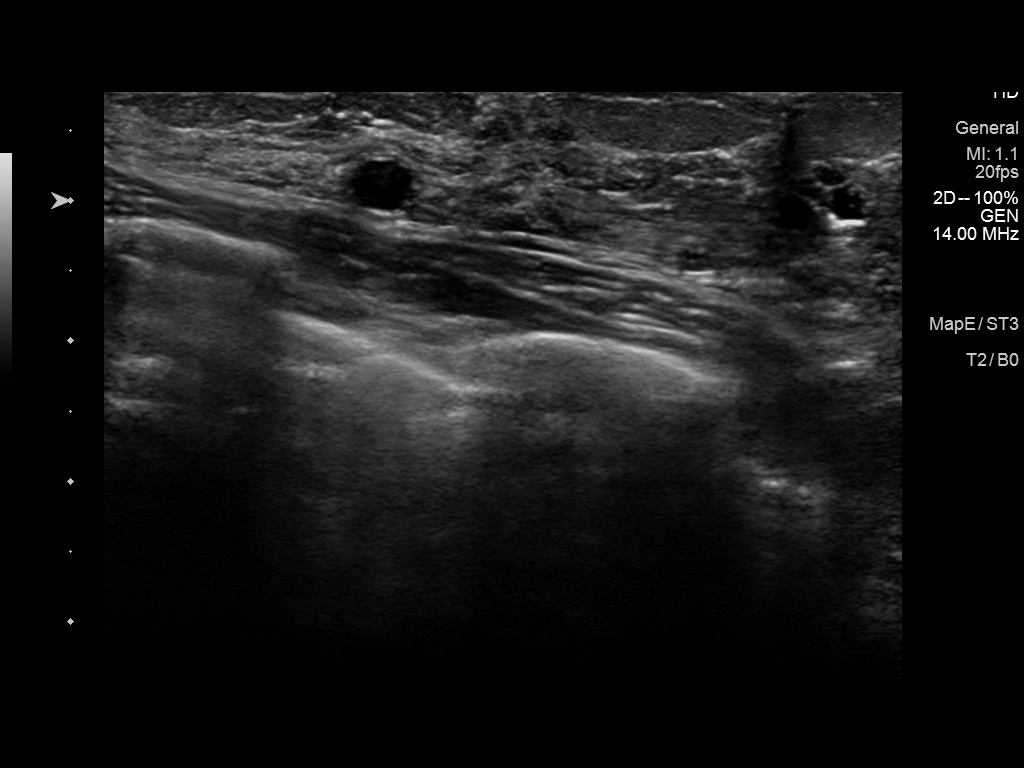
[im 3/5]
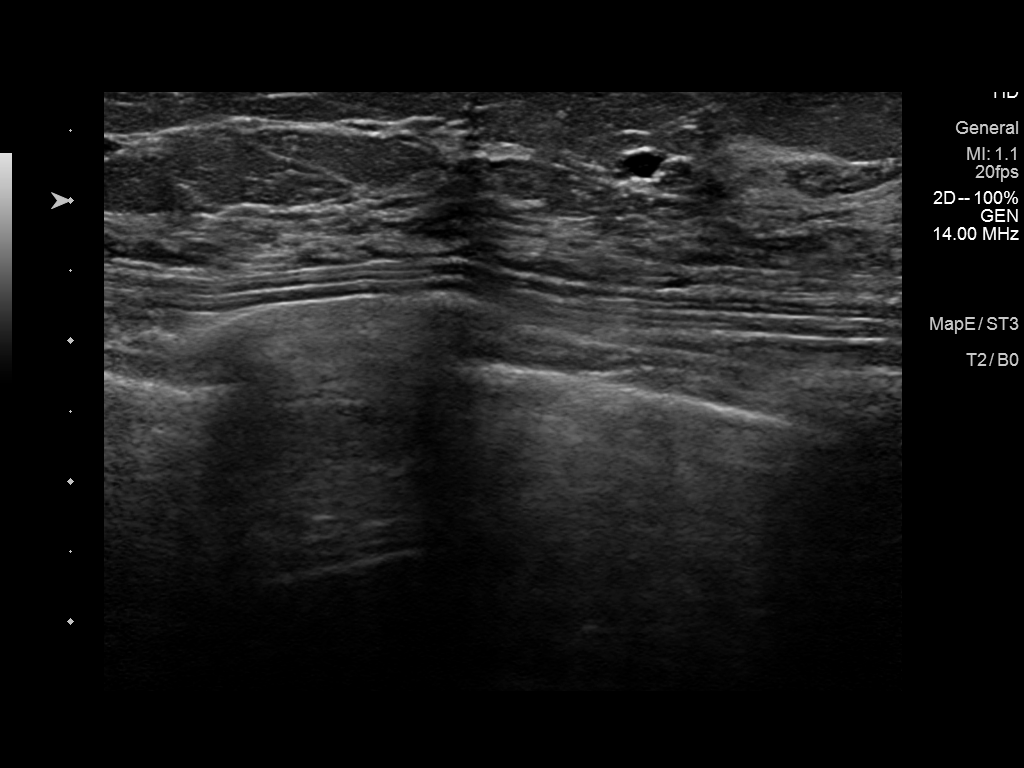
[im 4/5]
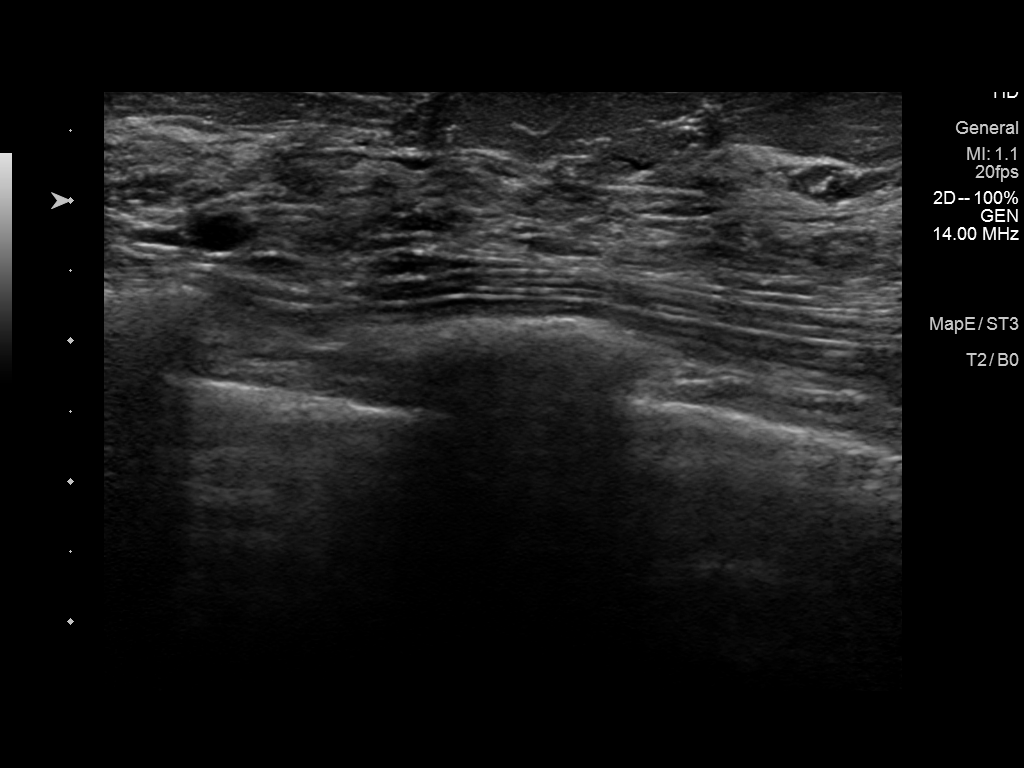
[im 5/5]
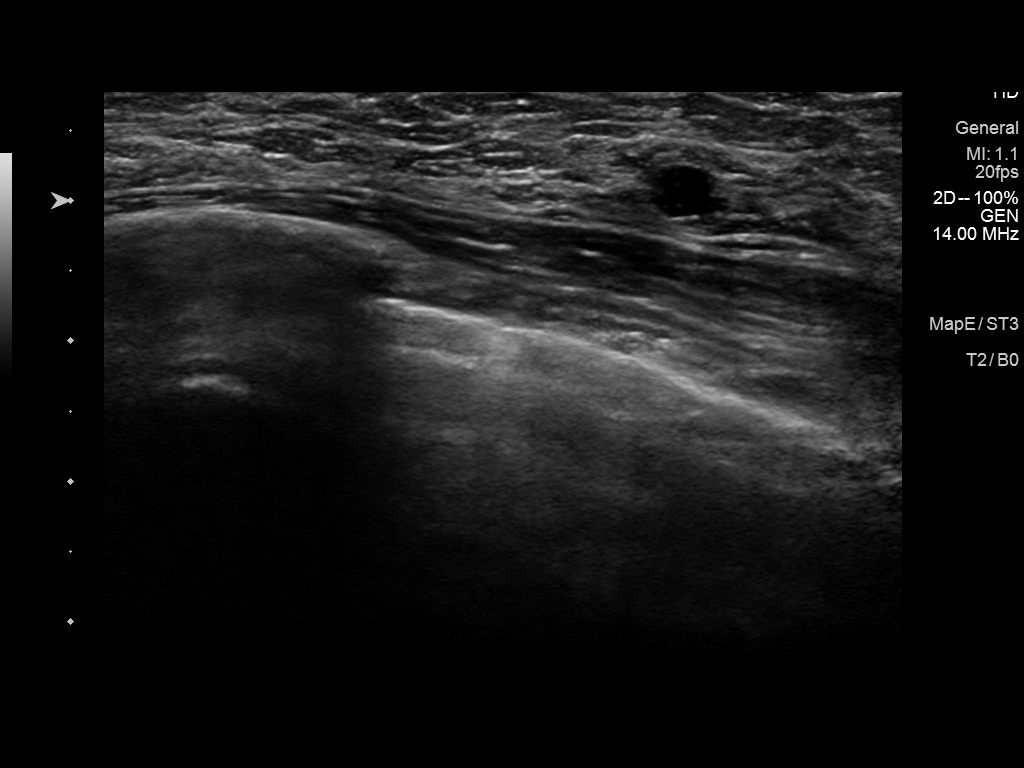

[5 of 5 positions shown; findings below may reference images not displayed]

ACR Breast Density Category c: The breast tissue is heterogeneously
dense, which may obscure small masses.
FINDINGS: No suspicious masses, calcifications, or distortion in the left
breast. No suspicious masses in the region of the patient's
right-sided palpable lump. Multiple obscured masses seen in the
right breast inferiorly and medially.

Mammographic images were processed with CAD.

On physical exam, no suspicious lumps are identified.

Targeted ultrasound is performed, showing numerous simple cysts
throughout the right breast correlating with mammographic findings.
The patient's recent lump which has resolved was most likely a cyst.
IMPRESSION: Fibrocystic changes.

RECOMMENDATION:
Annual screening mammography beginning at the age of 40.

I have discussed the findings and recommendations with the patient.
Results were also provided in writing at the conclusion of the
visit. If applicable, a reminder letter will be sent to the patient
regarding the next appointment.

BI-RADS CATEGORY  2: Benign.

## 2023-02-25 ENCOUNTER — Telehealth: Payer: Self-pay

## 2023-02-25 NOTE — Telephone Encounter (Signed)
Mychart msg sent

## 2023-08-06 DIAGNOSIS — F418 Other specified anxiety disorders: Secondary | ICD-10-CM | POA: Diagnosis not present

## 2023-08-06 DIAGNOSIS — L723 Sebaceous cyst: Secondary | ICD-10-CM | POA: Diagnosis not present

## 2023-08-06 DIAGNOSIS — Z1231 Encounter for screening mammogram for malignant neoplasm of breast: Secondary | ICD-10-CM | POA: Diagnosis not present

## 2023-09-16 DIAGNOSIS — Z124 Encounter for screening for malignant neoplasm of cervix: Secondary | ICD-10-CM | POA: Diagnosis not present

## 2024-01-16 DIAGNOSIS — J101 Influenza due to other identified influenza virus with other respiratory manifestations: Secondary | ICD-10-CM | POA: Diagnosis not present

## 2024-01-16 DIAGNOSIS — R070 Pain in throat: Secondary | ICD-10-CM | POA: Diagnosis not present

## 2024-01-16 DIAGNOSIS — Z20822 Contact with and (suspected) exposure to covid-19: Secondary | ICD-10-CM | POA: Diagnosis not present

## 2024-08-08 DIAGNOSIS — H5213 Myopia, bilateral: Secondary | ICD-10-CM | POA: Diagnosis not present

## 2024-09-18 ENCOUNTER — Other Ambulatory Visit: Payer: Self-pay | Admitting: Medical Genetics

## 2024-09-22 ENCOUNTER — Other Ambulatory Visit (HOSPITAL_COMMUNITY)

## 2024-09-22 ENCOUNTER — Other Ambulatory Visit (HOSPITAL_COMMUNITY)
Admission: RE | Admit: 2024-09-22 | Discharge: 2024-09-22 | Disposition: A | Discharge status: Home / Self Care | Payer: Self-pay | Source: Ambulatory Visit | Attending: Medical Genetics | Admitting: Medical Genetics

## 2024-09-29 LAB — GENECONNECT MOLECULAR SCREEN: Genetic Analysis Overall Interpretation: NEGATIVE

## 2024-09-30 DIAGNOSIS — M545 Low back pain, unspecified: Secondary | ICD-10-CM | POA: Diagnosis not present

## 2024-09-30 DIAGNOSIS — Z1231 Encounter for screening mammogram for malignant neoplasm of breast: Secondary | ICD-10-CM | POA: Diagnosis not present

## 2024-09-30 DIAGNOSIS — F418 Other specified anxiety disorders: Secondary | ICD-10-CM | POA: Diagnosis not present

## 2024-09-30 DIAGNOSIS — Z8249 Family history of ischemic heart disease and other diseases of the circulatory system: Secondary | ICD-10-CM | POA: Diagnosis not present

## 2024-09-30 DIAGNOSIS — R7309 Other abnormal glucose: Secondary | ICD-10-CM | POA: Diagnosis not present

## 2024-09-30 DIAGNOSIS — Z800 Family history of malignant neoplasm of digestive organs: Secondary | ICD-10-CM | POA: Diagnosis not present

## 2024-09-30 DIAGNOSIS — Z0000 Encounter for general adult medical examination without abnormal findings: Secondary | ICD-10-CM | POA: Diagnosis not present

## 2024-09-30 DIAGNOSIS — Z23 Encounter for immunization: Secondary | ICD-10-CM | POA: Diagnosis not present

## 2024-09-30 DIAGNOSIS — Z1211 Encounter for screening for malignant neoplasm of colon: Secondary | ICD-10-CM | POA: Diagnosis not present

## 2024-09-30 DIAGNOSIS — F17210 Nicotine dependence, cigarettes, uncomplicated: Secondary | ICD-10-CM | POA: Diagnosis not present

## 2024-10-27 DIAGNOSIS — Z1231 Encounter for screening mammogram for malignant neoplasm of breast: Secondary | ICD-10-CM | POA: Diagnosis not present
